# Patient Record
Sex: Female | Born: 1985 | Race: White | Hispanic: No | Marital: Single | Smoking: Current every day smoker
Health system: Southern US, Community
[De-identification: ages and names within clinical notes are randomized; demographics above are authoritative.]

## PROBLEM LIST (undated history)

## (undated) DIAGNOSIS — G8929 Other chronic pain: Secondary | ICD-10-CM

## (undated) DIAGNOSIS — M79605 Pain in left leg: Secondary | ICD-10-CM

## (undated) HISTORY — PX: LEG SURGERY: SHX1003

## (undated) HISTORY — PX: ABDOMINAL HYSTERECTOMY: SHX81

---

## 2017-04-22 ENCOUNTER — Emergency Department (HOSPITAL_BASED_OUTPATIENT_CLINIC_OR_DEPARTMENT_OTHER): Payer: Self-pay

## 2017-04-22 ENCOUNTER — Emergency Department (HOSPITAL_BASED_OUTPATIENT_CLINIC_OR_DEPARTMENT_OTHER)
Admission: EM | Admit: 2017-04-22 | Discharge: 2017-04-22 | Disposition: A | Discharge status: Home / Self Care | Payer: Self-pay | Attending: Emergency Medicine | Admitting: Emergency Medicine

## 2017-04-22 ENCOUNTER — Encounter (HOSPITAL_BASED_OUTPATIENT_CLINIC_OR_DEPARTMENT_OTHER): Payer: Self-pay | Admitting: Emergency Medicine

## 2017-04-22 DIAGNOSIS — M84462A Pathological fracture, left tibia, initial encounter for fracture: Secondary | ICD-10-CM | POA: Insufficient documentation

## 2017-04-22 DIAGNOSIS — F17210 Nicotine dependence, cigarettes, uncomplicated: Secondary | ICD-10-CM | POA: Insufficient documentation

## 2017-04-22 DIAGNOSIS — G8929 Other chronic pain: Secondary | ICD-10-CM | POA: Insufficient documentation

## 2017-04-22 DIAGNOSIS — M8440XA Pathological fracture, unspecified site, initial encounter for fracture: Secondary | ICD-10-CM

## 2017-04-22 DIAGNOSIS — M25512 Pain in left shoulder: Secondary | ICD-10-CM | POA: Insufficient documentation

## 2017-04-22 HISTORY — DX: Pain in left leg: M79.605

## 2017-04-22 HISTORY — DX: Other chronic pain: G89.29

## 2017-04-22 MED ORDER — OXYCODONE-ACETAMINOPHEN 5-325 MG PO TABS: 1.0000 | ORAL_TABLET | Freq: Once | ORAL | Status: DC

## 2017-04-22 MED ORDER — CYCLOBENZAPRINE HCL 10 MG PO TABS: 10.0000 mg | ORAL_TABLET | Freq: Two times a day (BID) | ORAL | 0 refills | Status: AC | PRN

## 2017-04-22 MED ORDER — OXYCODONE-ACETAMINOPHEN 5-325 MG PO TABS: 1.0000 | ORAL_TABLET | Freq: Three times a day (TID) | ORAL | 0 refills | Status: AC | PRN
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
## 2017-04-22 NOTE — Discharge Instructions (Signed)
You can use the sling as needed for comfort. Please call [facility] to get established with a primary care provider. You can take Flexeril up to 2 times per day as needed for muscle pain and spasms. Please do not use this medication before you work or drive because it can make you sleepy. You can take 800 mg of Advil with food every 8 hours as needed for pain and inflammation control. Please use caution with Percocet. Do not take this medication before you work or drive. It is a narcotic and can be addicting. You can apply ice or heat as needed to help with pain and inflammation.   If you develop new or worsening symptoms, including numbness or weakness in your left arm, please return to the emergency department for reevaluation.

## 2017-04-22 NOTE — ED Notes (Signed)
Pt states she will have to find a ride, to receive narcotic pain med. Verbal understanding

## 2017-04-22 NOTE — ED Triage Notes (Signed)
Reports pain to left lower leg x 3 weeks ago.  States she has rod in left leg due to previous surgery.  Reports left arm and shoulder pain as well which began 4 days ago.
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
## 2017-06-07 ENCOUNTER — Emergency Department (HOSPITAL_BASED_OUTPATIENT_CLINIC_OR_DEPARTMENT_OTHER)
Admission: EM | Admit: 2017-06-07 | Discharge: 2017-06-07 | Disposition: A | Discharge status: Home / Self Care | Payer: Self-pay | Attending: Emergency Medicine | Admitting: Emergency Medicine

## 2017-06-07 ENCOUNTER — Encounter (HOSPITAL_BASED_OUTPATIENT_CLINIC_OR_DEPARTMENT_OTHER): Payer: Self-pay | Admitting: *Deleted

## 2017-06-07 DIAGNOSIS — X58XXXA Exposure to other specified factors, initial encounter: Secondary | ICD-10-CM | POA: Insufficient documentation

## 2017-06-07 DIAGNOSIS — S99912A Unspecified injury of left ankle, initial encounter: Secondary | ICD-10-CM | POA: Insufficient documentation

## 2017-06-07 DIAGNOSIS — Z5321 Procedure and treatment not carried out due to patient leaving prior to being seen by health care provider: Secondary | ICD-10-CM | POA: Insufficient documentation

## 2017-06-07 DIAGNOSIS — Y929 Unspecified place or not applicable: Secondary | ICD-10-CM | POA: Insufficient documentation

## 2017-06-07 DIAGNOSIS — Y939 Activity, unspecified: Secondary | ICD-10-CM | POA: Insufficient documentation

## 2017-06-07 DIAGNOSIS — Y999 Unspecified external cause status: Secondary | ICD-10-CM | POA: Insufficient documentation

## 2017-06-07 NOTE — ED Notes (Signed)
Called x2, no answer 

## 2017-06-07 NOTE — ED Triage Notes (Signed)
Pt c/o left ankle pain ,  hx chronic  Ankle pain

## 2017-06-07 NOTE — ED Notes (Signed)
Called x1, no answer

## 2017-06-07 NOTE — ED Notes (Signed)
Called x 3, no answer.  

## 2017-06-25 ENCOUNTER — Encounter (HOSPITAL_BASED_OUTPATIENT_CLINIC_OR_DEPARTMENT_OTHER): Payer: Self-pay | Admitting: Emergency Medicine

## 2017-06-25 ENCOUNTER — Emergency Department (HOSPITAL_BASED_OUTPATIENT_CLINIC_OR_DEPARTMENT_OTHER): Payer: Self-pay

## 2017-06-25 ENCOUNTER — Emergency Department (HOSPITAL_BASED_OUTPATIENT_CLINIC_OR_DEPARTMENT_OTHER)
Admission: EM | Admit: 2017-06-25 | Discharge: 2017-06-25 | Disposition: A | Discharge status: Home / Self Care | Payer: Self-pay | Attending: Emergency Medicine | Admitting: Emergency Medicine

## 2017-06-25 DIAGNOSIS — R52 Pain, unspecified: Secondary | ICD-10-CM

## 2017-06-25 DIAGNOSIS — G8929 Other chronic pain: Secondary | ICD-10-CM | POA: Insufficient documentation

## 2017-06-25 DIAGNOSIS — M79605 Pain in left leg: Secondary | ICD-10-CM | POA: Insufficient documentation

## 2017-06-25 DIAGNOSIS — F17200 Nicotine dependence, unspecified, uncomplicated: Secondary | ICD-10-CM | POA: Insufficient documentation

## 2017-06-25 MED ORDER — TRAMADOL HCL 50 MG PO TABS: 50.0000 mg | ORAL_TABLET | Freq: Four times a day (QID) | ORAL | 0 refills | Status: AC | PRN

## 2017-06-25 MED ORDER — TRAMADOL HCL 50 MG PO TABS
50.0000 mg | ORAL_TABLET | Freq: Once | ORAL | Status: AC
  Administered 2017-06-25: 50 mg via ORAL
  Filled 2017-06-25: qty 1

## 2017-06-25 NOTE — Discharge Instructions (Signed)
It was our pleasure to provide your ER care today - we hope that you feel better.  The radiologist looked at your xrays and indicates they appear unchanged from prior - they do not see any specific problem with your hardware, it appears intact.    Take motrin or aleve as need for pain.  You may also take acetaminophen as need for pain.  You may take ultram as need for pain - no driving for the next 6 hours, or when taking ultram.   Follow up with orthopedic doctor in the next few weeks - see referral - call office Monday for appointment.   You are also provided a referral to pain clinic for pain management options - call Monday.

## 2017-06-25 NOTE — ED Triage Notes (Signed)
PT presents with c/o pain to left lower leg. Pt states she has titanium rod and says recent xray showed it was cracked and has a lot of pain.
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
## 2017-06-25 NOTE — ED Notes (Signed)
Patient transported to X-ray 
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
